# Patient Record
Sex: Female | Born: 2005 | Race: Asian | Hispanic: No | Marital: Single | Smoking: Never smoker
Health system: Southern US, Community
[De-identification: ages and names within clinical notes are randomized; demographics above are authoritative.]

## PROBLEM LIST (undated history)

## (undated) DIAGNOSIS — I10 Essential (primary) hypertension: Secondary | ICD-10-CM

## (undated) HISTORY — DX: Essential (primary) hypertension: I10

---
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
## 2024-01-03 ENCOUNTER — Ambulatory Visit: Admitting: Nurse Practitioner

## 2024-03-07 ENCOUNTER — Other Ambulatory Visit: Payer: Self-pay | Admitting: Nurse Practitioner

## 2024-03-10 NOTE — Telephone Encounter (Signed)
 Requested Prescriptions  Pending Prescriptions Disp Refills   escitalopram  (LEXAPRO ) 10 MG tablet [Pharmacy Med Name: Escitalopram  Oxalate 10 MG Oral Tablet] 90 tablet 0    Sig: Take 1 tablet by mouth once daily     Psychiatry:  Antidepressants - SSRI Passed - 03/10/2024 11:41 AM      Passed - Completed PHQ-2 or PHQ-9 in the last 360 days      Passed - Valid encounter within last 6 months    Recent Outpatient Visits           4 months ago Depression, recurrent   [place] [facility] Melvin Pao, NP

## 2024-04-08 ENCOUNTER — Ambulatory Visit: Admitting: Nurse Practitioner

## 2024-04-08 ENCOUNTER — Encounter: Payer: Self-pay | Admitting: Nurse Practitioner

## 2024-04-08 VITALS — BP 121/67 | HR 68 | Temp 98.4°F | Ht 61.02 in | Wt 195.8 lb

## 2024-04-08 DIAGNOSIS — G8929 Other chronic pain: Secondary | ICD-10-CM | POA: Diagnosis not present

## 2024-04-08 DIAGNOSIS — M25562 Pain in left knee: Secondary | ICD-10-CM | POA: Diagnosis not present

## 2024-04-08 DIAGNOSIS — F419 Anxiety disorder, unspecified: Secondary | ICD-10-CM | POA: Diagnosis not present

## 2024-04-08 MED ORDER — ESCITALOPRAM OXALATE 20 MG PO TABS: 20.0000 mg | ORAL_TABLET | Freq: Every day | ORAL | 0 refills | Status: AC

## 2024-04-08 NOTE — Assessment & Plan Note (Signed)
 Chronic. Not well controlled.  Will increase Lexapro  to 20mg  daily.  Follow up in 1 month.  Recommend being consistent with taking medication.  Call sooner if concerns arise.

## 2024-04-08 NOTE — Progress Notes (Signed)
 "  BP 121/67 (BP Location: Right Arm, Patient Position: Sitting, Cuff Size: Normal)   Pulse 68   Temp 98.4 F (36.9 C) (Oral)   Ht 5' 1.02 (1.55 m)   Wt 195 lb 12.8 oz (88.8 kg)   LMP 03/20/2024 (Approximate)   SpO2 98%   BMI 36.97 kg/m    Subjective:    Patient ID: Linda Lambert, female    DOB: 2005/12/29, 18 y.o.   MRN: [id number]  HPI: Linda Lambert is a 18 y.o. female  Chief Complaint  Patient presents with   office visit    Follow-up. Patient stated she is having a lot of knee pain. (Left knee)   DEPRESSION/ANXIETY Patient states she is still feeling anxious.  States she doesn't feel any difference with taking the medication.  She states she felt better the first time she took the medication than she does now.  She still feels very anxious and having a lot of depression.  She has attachments on certain people so that has made her mood sometimes better but a lot of times worse.  She is able to calm herself down.  She has lost a few of the connections and not trying to rely on other people.  Denies SI.   Flowsheet Row Office Visit from 04/08/2024 in [facility]  PHQ-9 Total Score 19      04/08/2024    3:33 PM 11/08/2023    4:09 PM 05/24/2022   10:37 AM 02/21/2022    9:50 AM  GAD 7 : Generalized Anxiety Score  Nervous, Anxious, on Edge 3 3 2 2   Control/stop worrying 3 3 3 2   Worry too much - different things 3 3 3 3   Trouble relaxing 2 2 2 2   Restless 1 1 1 1   Easily annoyed or irritable 3 3 1 2   Afraid - awful might happen 1 3 1 2   Total GAD 7 Score 16 18 13 14   Anxiety Difficulty Very difficult Somewhat difficult Somewhat difficult Somewhat difficult   Patient states she has been having knee pain.  States every time she bends down to pick something up she feels a sensitivity.  She feels a pop at times.  Some days it is fine.  Stretching helps. But the pain comes back.  When she was in high school she fell a couple of times and feels like that  may have caused the pain.  Patient rates the pain at a 5/10.  She does feel weakness and has almost fallen twice.  Going down the stairs is when she feels the weakness.    Relevant past medical, surgical, family and social history reviewed and updated as indicated. Interim medical history since our last visit reviewed. Allergies and medications reviewed and updated.  Review of Systems  Psychiatric/Behavioral:  Positive for decreased concentration and dysphoric mood. Negative for suicidal ideas. The patient is nervous/anxious.     Per HPI unless specifically indicated above     Objective:    BP 121/67 (BP Location: Right Arm, Patient Position: Sitting, Cuff Size: Normal)   Pulse 68   Temp 98.4 F (36.9 C) (Oral)   Ht 5' 1.02 (1.55 m)   Wt 195 lb 12.8 oz (88.8 kg)   LMP 03/20/2024 (Approximate)   SpO2 98%   BMI 36.97 kg/m   Wt Readings from Last 3 Encounters:  04/08/24 195 lb 12.8 oz (88.8 kg) (97%, Z= 1.87)*  12/10/23 197 lb 11.2 oz (89.7 kg) (97%, Z= 1.91)*  11/08/23 200 lb (90.7 kg) (97%, Z= 1.95)*   * Growth percentiles are based on CDC (Girls, 2-20 Years) data.    Physical Exam Vitals and nursing note reviewed.  Constitutional:      General: She is not in acute distress.    Appearance: Normal appearance. She is obese. She is not ill-appearing, toxic-appearing or diaphoretic.  HENT:     Head: Normocephalic.     Right Ear: External ear normal.     Left Ear: External ear normal.     Nose: Nose normal.     Mouth/Throat:     Mouth: Mucous membranes are moist.     Pharynx: Oropharynx is clear.  Eyes:     General:        Right eye: No discharge.        Left eye: No discharge.     Extraocular Movements: Extraocular movements intact.     Conjunctiva/sclera: Conjunctivae normal.     Pupils: Pupils are equal, round, and reactive to light.  Cardiovascular:     Rate and Rhythm: Normal rate and regular rhythm.     Heart sounds: No murmur heard. Pulmonary:     Effort:  Pulmonary effort is normal. No respiratory distress.     Breath sounds: Normal breath sounds. No wheezing or rales.  Musculoskeletal:        General: No swelling, tenderness, deformity or signs of injury. Normal range of motion.     Cervical back: Normal range of motion and neck supple.     Right lower leg: No edema.     Left lower leg: No edema.  Skin:    General: Skin is warm and dry.     Capillary Refill: Capillary refill takes less than 2 seconds.  Neurological:     General: No focal deficit present.     Mental Status: She is alert and oriented to person, place, and time. Mental status is at baseline.  Psychiatric:        Mood and Affect: Mood normal.        Behavior: Behavior normal.        Thought Content: Thought content normal.        Judgment: Judgment normal.     No results found for this or any previous visit.    Assessment & Plan:   Problem List Items Addressed This Visit       Other   Anxiety - Primary   Chronic. Not well controlled.  Will increase Lexapro  to 20mg  daily.  Follow up in 1 month.  Recommend being consistent with taking medication.  Call sooner if concerns arise.       Relevant Medications   escitalopram  (LEXAPRO ) 20 MG tablet   Other Visit Diagnoses       Chronic pain of left knee       Referral placed for patient to see Ortho for evaluation and treatment.   Relevant Medications   escitalopram  (LEXAPRO ) 20 MG tablet   Other Relevant Orders   Ambulatory referral to Orthopedic Surgery          Follow up plan: Return in about 1 month (around 05/09/2024) for Depression/Anxiety FU.      "

## 2024-04-21 ENCOUNTER — Ambulatory Visit

## 2024-05-05 ENCOUNTER — Encounter: Admitting: Nurse Practitioner

## 2024-05-05 NOTE — Progress Notes (Unsigned)
 "  LMP 03/20/2024 (Approximate)    Subjective:    Patient ID: Linda Lambert, female    DOB: [birth year], 19 y.o.   MRN: [id number]  HPI: Linda Lambert is a 19 y.o. female presenting on 05/05/2024 for comprehensive medical examination. Current medical complaints include:{Blank single:19197::none,***}  She currently lives with: Menopausal Symptoms: {Blank single:19197::yes,no}  Depression Screen done today and results listed below:     04/08/2024    3:33 PM 11/08/2023    4:09 PM 05/24/2022   10:37 AM 02/21/2022    9:50 AM 01/18/2022   10:10 AM  Depression screen PHQ 2/9  Decreased Interest 3 1 1 1 2   Down, Depressed, Hopeless 1 2 2 2 1   PHQ - 2 Score 4 3 3 3 3   Altered sleeping 3 2 3 2 1   Tired, decreased energy 3 3 3 2 3   Change in appetite 3 2 2 2 1   Feeling bad or failure about yourself  2 2 2 3 1   Trouble concentrating 1 2 1 2 1   Moving slowly or fidgety/restless 2 1 1 1 1   Suicidal thoughts 1 2 0 1 0  PHQ-9 Score 19 17  15  16  11    Difficult doing work/chores Very difficult Somewhat difficult Somewhat difficult Somewhat difficult Somewhat difficult     Data saved with a previous flowsheet row definition    The patient {has/does not have:19849} a history of falls. I {did/did not:19850} complete a risk assessment for falls. A plan of care for falls {was/was not:19852} documented.   Past Medical History:  Past Medical History:  Diagnosis Date   Hypertension     Surgical History:  No past surgical history on file.  Medications:  Current Outpatient Medications on File Prior to Visit  Medication Sig   escitalopram  (LEXAPRO ) 20 MG tablet Take 1 tablet (20 mg total) by mouth daily.   No current facility-administered medications on file prior to visit.    Allergies:  Allergies[1]  Social History:  Social History   Socioeconomic History   Marital status: Single    Spouse name: Not on file   Number of children: Not on file   Years of education: Not on file    Highest education level: Not on file  Occupational History   Not on file  Tobacco Use   Smoking status: Never   Smokeless tobacco: Never  Vaping Use   Vaping status: Never Used  Substance and Sexual Activity   Alcohol use: Never   Drug use: Never   Sexual activity: Not on file  Other Topics Concern   Not on file  Social History Narrative   Not on file   Social Drivers of Health   Tobacco Use: Low Risk (04/08/2024)   Patient History    Smoking Tobacco Use: Never    Smokeless Tobacco Use: Never    Passive Exposure: Not on file  Financial Resource Strain: Not on file  Food Insecurity: Not on file  Transportation Needs: Not on file  Physical Activity: Not on file  Stress: Not on file  Social Connections: Not on file  Intimate Partner Violence: Not on file  Depression (PHQ2-9): High Risk (04/08/2024)   Depression (PHQ2-9)    PHQ-2 Score: 19  Alcohol Screen: Not on file  Housing: Not on file  Utilities: Not on file  Health Literacy: Not on file   Tobacco Use History[2] Social History   Substance and Sexual Activity  Alcohol Use Never    Family History:  Family History  Problem Relation Age of Onset   Arthritis Mother    Hypertension Mother    Depression Mother    Anxiety disorder Mother    Restless legs syndrome Mother    Allergies Mother    Hypertension Father    Hyperlipidemia Father    Diabetes Father    Heart attack Father    Depression Sister    Anxiety disorder Sister    Arthritis Maternal Grandmother    Depression Maternal Grandmother    Anxiety disorder Maternal Grandmother    Hypothyroidism Maternal Grandmother    Emphysema Maternal Grandfather    Heart disease Paternal Grandmother    Heart attack Paternal Grandmother     Past medical history, surgical history, medications, allergies, family history and social history reviewed with patient today and changes made to appropriate areas of the chart.   ROS All other ROS negative except what  is listed above and in the HPI.      Objective:    LMP 03/20/2024 (Approximate)   Wt Readings from Last 3 Encounters:  04/08/24 195 lb 12.8 oz (88.8 kg) (97%, Z= 1.87)*  12/10/23 197 lb 11.2 oz (89.7 kg) (97%, Z= 1.91)*  11/08/23 200 lb (90.7 kg) (97%, Z= 1.95)*   * Growth percentiles are based on CDC (Girls, 2-20 Years) data.    Physical Exam  No results found for this or any previous visit.    Assessment & Plan:   Problem List Items Addressed This Visit   None    Follow up plan: No follow-ups on file.   LABORATORY TESTING:  - Pap smear: {Blank single:19197::pap done,not applicable,up to date,done elsewhere}  IMMUNIZATIONS:   - Tdap: Tetanus vaccination status reviewed: {tetanus status:315746}. - Influenza: {Blank single:19197::Up to date,Administered today,Postponed to flu season,Refused,Given elsewhere} - Pneumovax: {Blank single:19197::Up to date,Administered today,Not applicable,Refused,Given elsewhere} - Prevnar: {Blank single:19197::Up to date,Administered today,Not applicable,Refused,Given elsewhere} - COVID: {Blank single:19197::Up to date,Administered today,Not applicable,Refused,Given elsewhere} - HPV: {Blank single:19197::Up to date,Administered today,Not applicable,Refused,Given elsewhere} - Shingrix vaccine: {Blank single:19197::Up to date,Administered today,Not applicable,Refused,Given elsewhere}  SCREENING: -Mammogram: {Blank single:19197::Up to date,Ordered today,Not applicable,Refused,Done elsewhere}  - Colonoscopy: {Blank single:19197::Up to date,Ordered today,Not applicable,Refused,Done elsewhere}  - Bone Density: {Blank single:19197::Up to date,Ordered today,Not applicable,Refused,Done elsewhere}  -Hearing Test: {Blank single:19197::Up to date,Ordered today,Not applicable,Refused,Done elsewhere}  -Spirometry: {Blank single:19197::Up to  date,Ordered today,Not applicable,Refused,Done elsewhere}   PATIENT COUNSELING:   Advised to take 1 mg of folate supplement per day if capable of pregnancy.   Sexuality: Discussed sexually transmitted diseases, partner selection, use of condoms, avoidance of unintended pregnancy  and contraceptive alternatives.   Advised to avoid cigarette smoking.  I discussed with the patient that most people either abstain from alcohol or drink within safe limits (<=14/week and <=4 drinks/occasion for males, <=7/weeks and <= 3 drinks/occasion for females) and that the risk for alcohol disorders and other health effects rises proportionally with the number of drinks per week and how often a drinker exceeds daily limits.  Discussed cessation/primary prevention of drug use and availability of treatment for abuse.   Diet: Encouraged to adjust caloric intake to maintain  or achieve ideal body weight, to reduce intake of dietary saturated fat and total fat, to limit sodium intake by avoiding high sodium foods and not adding table salt, and to maintain adequate dietary potassium and calcium preferably from fresh fruits, vegetables, and low-fat dairy products.    stressed the importance of regular exercise  Injury prevention: Discussed safety belts, safety helmets, smoke detector, smoking  near bedding or upholstery.   Dental health: Discussed importance of regular tooth brushing, flossing, and dental visits.    NEXT PREVENTATIVE PHYSICAL DUE IN 1 YEAR. No follow-ups on file.             [1] No Known Allergies [2]  Social History Tobacco Use  Smoking Status Never  Smokeless Tobacco Never   "
